# Patient Record
Sex: Female | Born: 1970 | Race: Black or African American | Hispanic: No | Marital: Single | Smoking: Current every day smoker
Health system: Southern US, Community
[De-identification: ages and names within clinical notes are randomized; demographics above are authoritative.]

## PROBLEM LIST (undated history)

## (undated) DIAGNOSIS — E119 Type 2 diabetes mellitus without complications: Secondary | ICD-10-CM

## (undated) DIAGNOSIS — F329 Major depressive disorder, single episode, unspecified: Secondary | ICD-10-CM

## (undated) DIAGNOSIS — F32A Depression, unspecified: Secondary | ICD-10-CM

---

## 2016-04-06 ENCOUNTER — Encounter (HOSPITAL_BASED_OUTPATIENT_CLINIC_OR_DEPARTMENT_OTHER): Payer: Self-pay | Admitting: Emergency Medicine

## 2016-04-06 ENCOUNTER — Emergency Department (HOSPITAL_BASED_OUTPATIENT_CLINIC_OR_DEPARTMENT_OTHER)
Admission: EM | Admit: 2016-04-06 | Discharge: 2016-04-06 | Disposition: A | Discharge status: Home / Self Care | Payer: Self-pay | Attending: Emergency Medicine | Admitting: Emergency Medicine

## 2016-04-06 ENCOUNTER — Emergency Department (HOSPITAL_BASED_OUTPATIENT_CLINIC_OR_DEPARTMENT_OTHER): Payer: Self-pay

## 2016-04-06 DIAGNOSIS — Y999 Unspecified external cause status: Secondary | ICD-10-CM | POA: Insufficient documentation

## 2016-04-06 DIAGNOSIS — F329 Major depressive disorder, single episode, unspecified: Secondary | ICD-10-CM | POA: Insufficient documentation

## 2016-04-06 DIAGNOSIS — Y9389 Activity, other specified: Secondary | ICD-10-CM | POA: Insufficient documentation

## 2016-04-06 DIAGNOSIS — X501XXA Overexertion from prolonged static or awkward postures, initial encounter: Secondary | ICD-10-CM | POA: Insufficient documentation

## 2016-04-06 DIAGNOSIS — F17200 Nicotine dependence, unspecified, uncomplicated: Secondary | ICD-10-CM | POA: Insufficient documentation

## 2016-04-06 DIAGNOSIS — Y92810 Car as the place of occurrence of the external cause: Secondary | ICD-10-CM | POA: Insufficient documentation

## 2016-04-06 DIAGNOSIS — M25562 Pain in left knee: Secondary | ICD-10-CM | POA: Insufficient documentation

## 2016-04-06 HISTORY — DX: Depression, unspecified: F32.A

## 2016-04-06 HISTORY — DX: Major depressive disorder, single episode, unspecified: F32.9

## 2016-04-06 MED ORDER — IBUPROFEN 800 MG PO TABS
800.0000 mg | ORAL_TABLET | Freq: Once | ORAL | Status: AC
  Administered 2016-04-06: 800 mg via ORAL
  Filled 2016-04-06: qty 1

## 2016-04-06 NOTE — Discharge Instructions (Signed)

## 2016-04-06 NOTE — ED Notes (Signed)
Pt in c/o L knee pain after twisting it and feeling a pop while getting a child out of a car. Pt ambulatory to triage with limp in NAD.

## 2016-04-06 NOTE — ED Provider Notes (Signed)
CSN: [id number]     Arrival date & time 04/06/16  1313 History   First MD Initiated Contact with Patient 04/06/16 1328     Chief Complaint  Patient presents with  . Knee Pain   Patient is a 45 y.o. female presenting with knee pain.  Knee Pain Location:  Knee Injury: yes   Mechanism of injury comment:  Twisting Knee location:  L knee Pain details:    Quality:  Aching   Radiates to:  Does not radiate   Severity:  Moderate   Onset quality:  Sudden   Timing:  Constant   Progression:  Unchanged Chronicity:  New Prior injury to area:  No Relieved by:  None tried Worsened by:  Bearing weight Ineffective treatments:  None tried Associated symptoms: swelling   Associated symptoms: no decreased ROM, no muscle weakness and no numbness   Risk factors: obesity    Ms. Meredeth IdeFleming is a 45 year old female presenting with knee pain. Patient was lifting her child out of the car seat when she twisted on a planted left leg and felt her knee pop. She denies falling. 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 The pain is lateral, anterior and medial. The pain does not radiate into the thigh or lower leg. She also is that her left knee and now looks more swollen than her right knee. She has not tried any over-the-counter medications for her pain. She denies numbness or weakness of the left lower extremity. She does not have an orthopedic doctor. Denies other injuries today.  Past Medical History  Diagnosis Date  . Depression    History reviewed. No pertinent past surgical history. History reviewed. No pertinent family history. Social History  Substance Use Topics  . Smoking status: Current Every Day Smoker  . Smokeless tobacco: None  . Alcohol Use: No   OB History    No data available     Review of Systems  All other systems reviewed and are negative.     Allergies  Review of patient's allergies indicates no known allergies.  Home  Medications   Prior to Admission medications   Not on File   BP 147/100 mmHg  Pulse 86  Temp(Src) 98.8 F (37.1 C) (Oral)  Resp 20  Ht 5\' 3"  (1.6 m)  Wt 132.45 kg  BMI 51.74 kg/m2  SpO2 98%  LMP 03/23/2016 Physical Exam  Constitutional: She appears well-developed and well-nourished. No distress.  HENT:  Head: Normocephalic and atraumatic.  Right Ear: External ear normal.  Left Ear: External ear normal.  Eyes: Conjunctivae are normal. Right eye exhibits no discharge. Left eye exhibits no discharge. No scleral icterus.  Neck: Normal range of motion.  Cardiovascular: Normal rate and intact distal pulses.   Feet warm. Pedal pulses palpable  Pulmonary/Chest: Effort normal.  Musculoskeletal: Normal range of motion.       Left knee: She exhibits normal range of motion, no swelling, no deformity, no erythema, normal alignment, no LCL laxity and no MCL laxity. Tenderness found. Medial joint line and lateral joint line tenderness noted.  Tenderness to palpation over the lateral joint line, anterior knee and medial joint line. No obvious swelling or deformity but the exam is limited by patient's morbid obesity. Full range of motion of the left knee intact. No ligamentous laxity. Patient is able to ambulate favoring the left knee  Neurological: She is alert. Coordination normal.  5/5 strength of BLE. Sensation intact to light  touch throughout.   Skin: Skin is warm and dry.  Psychiatric: She has a normal mood and affect. Her behavior is normal.  Nursing note and vitals reviewed.   ED Course  Procedures (including critical care time) Labs Review Labs Reviewed - No data to display  Imaging Review Dg Knee Complete 4 Views Left  04/06/2016  CLINICAL DATA:  Left knee injury gating baby out of car today. Pain along the medial aspect of the knee. EXAM: LEFT KNEE - COMPLETE 4+ VIEW COMPARISON:  None available. FINDINGS: The left knee is located. Mild degenerative changes are noted. Joint spaces  preserved. No acute osseous abnormality is present. A small joint effusion is suspected IMPRESSION: 1. No acute osseous abnormality. 2. Probable small joint effusion. Internal derangement is not excluded. This is unlikely. The effusion is likely inflammatory. Electronically Signed   By: Marin Robertshristopher  Mattern M.D.   On: 04/06/2016 13:41   I have personally reviewed and evaluated these images and lab results as part of my medical decision-making.   EKG Interpretation None      MDM   Final diagnoses:  Left knee pain   Patient presenting with left knee pain after twisting injury. Left lower extremity is neurovascularly intact with FROM. TTP at anterior, medial and lateral joint lines. No obvious swelling. Patient X-Ray negative for obvious fracture or dislocation. Question small effusion. Pain managed in ED with ibuprofen. Pt is able to ambulate with a steady gait. Brace and crutches given and conservative therapy recommended. Discussed RICE therapy and use of OTC pain relievers. Pt advised to follow up with orthopedics if symptoms persist. Return precautions discussed at bedside and given in discharge paperwork. Pt is stable for discharge.     Alveta HeimlichStevi Brittanie Dosanjh, PA-C 04/06/16 1445  Laurence Spatesachel Morgan Little, MD 04/06/16 [phone]

## 2016-05-18 ENCOUNTER — Emergency Department (HOSPITAL_BASED_OUTPATIENT_CLINIC_OR_DEPARTMENT_OTHER)
Admission: EM | Admit: 2016-05-18 | Discharge: 2016-05-18 | Disposition: A | Discharge status: Home / Self Care | Payer: Medicaid Other | Attending: Emergency Medicine | Admitting: Emergency Medicine

## 2016-05-18 ENCOUNTER — Encounter (HOSPITAL_BASED_OUTPATIENT_CLINIC_OR_DEPARTMENT_OTHER): Payer: Self-pay | Admitting: Emergency Medicine

## 2016-05-18 DIAGNOSIS — Z79899 Other long term (current) drug therapy: Secondary | ICD-10-CM | POA: Insufficient documentation

## 2016-05-18 DIAGNOSIS — M62838 Other muscle spasm: Secondary | ICD-10-CM | POA: Insufficient documentation

## 2016-05-18 DIAGNOSIS — F17200 Nicotine dependence, unspecified, uncomplicated: Secondary | ICD-10-CM | POA: Insufficient documentation

## 2016-05-18 MED ORDER — NAPROXEN 500 MG PO TABS: 500.0000 mg | ORAL_TABLET | Freq: Two times a day (BID) | ORAL | 0 refills | Status: DC

## 2016-05-18 MED ORDER — METHOCARBAMOL 500 MG PO TABS
1000.0000 mg | ORAL_TABLET | Freq: Once | ORAL | Status: AC
  Administered 2016-05-18: 1000 mg via ORAL
  Filled 2016-05-18: qty 2

## 2016-05-18 MED ORDER — METHOCARBAMOL 500 MG PO TABS: 500.0000 mg | ORAL_TABLET | Freq: Two times a day (BID) | ORAL | 0 refills | Status: DC

## 2016-05-18 MED ORDER — IBUPROFEN 800 MG PO TABS
800.0000 mg | ORAL_TABLET | Freq: Once | ORAL | Status: AC
  Administered 2016-05-18: 800 mg via ORAL
  Filled 2016-05-18: qty 1

## 2016-05-18 NOTE — ED Triage Notes (Signed)
Pt reports she awoke from sleep 3 or 4 days ago with right shoulder pain denies event or injury OTC meds ineffective

## 2016-05-18 NOTE — ED Notes (Signed)
Woke w rt shoulder pain on Friday  Denies inj

## 2016-05-18 NOTE — ED Provider Notes (Addendum)
[facility]   CSN: [id number] Arrival date & time: 05/18/16  [id number]  First Provider Contact:  First MD Initiated Contact with Patient 05/18/16 [phone]        History   Chief Complaint Chief Complaint  Patient presents with  . Shoulder Pain    HPI Michelle Arroyo is a 45 y.o. female.  The history is provided by the patient. No language interpreter was used.  Shoulder Pain   This is a new problem. The current episode started more than 2 days ago. The problem occurs constantly. The problem has not changed since onset.The pain is present in the right shoulder. The quality of the pain is described as aching. The pain is moderate. Pertinent negatives include no numbness, no tingling and no itching. Exacerbated by: nothing. Treatments tried: one dose of ibuprofen a few days  The treatment provided no relief. There has been no history of extremity trauma. Family history is significant for no rheumatoid arthritis and no gout.    Past Medical History:  Diagnosis Date  . Depression     There are no active problems to display for this patient.   History reviewed. No pertinent surgical history.  OB History    No data available       Home Medications    Prior to Admission medications   Medication Sig Start Date End Date Taking? Authorizing Provider  sertraline (ZOLOFT) 25 MG tablet Take 10 mg by mouth 2 times daily at 12 noon and 4 pm.   Yes Historical Provider, MD    Family History No family history on file.  Social History Social History  Substance Use Topics  . Smoking status: Current Every Day Smoker  . Smokeless tobacco: Never Used  . Alcohol use No     Allergies   Review of patient's allergies indicates no known allergies.   Review of Systems Review of Systems  Constitutional: Negative for fever.  Skin: Negative for itching.  Neurological: Negative for tingling, weakness and numbness.  All other systems reviewed and are  negative.    Physical Exam Updated Vital Signs BP 133/85 (BP Location: Left Arm)   Pulse 88   Temp 98.6 F (37 C) (Oral)   Resp 20   Ht 5\' 3"  (1.6 m)   Wt 285 lb (129.3 kg)   LMP 05/11/2016   SpO2 97%   BMI 50.49 kg/m   Physical Exam  Constitutional: She is oriented to person, place, and time. She appears well-developed and well-nourished.  HENT:  Head: Normocephalic and atraumatic.  Mouth/Throat: No oropharyngeal exudate.  Eyes: EOM are normal. Pupils are equal, round, and reactive to light.  Neck: Normal range of motion. Neck supple.  Cardiovascular: Normal rate, regular rhythm and intact distal pulses.   Pulmonary/Chest: Effort normal and breath sounds normal. She has no wheezes.  Abdominal: Soft. Bowel sounds are normal. She exhibits no mass. There is no tenderness. There is no rebound and no guarding.  Musculoskeletal:       Right shoulder: She exhibits spasm. She exhibits normal range of motion, no tenderness, no bony tenderness, no swelling, no effusion, no crepitus, no deformity, no laceration, no pain, normal pulse and normal strength.       Cervical back: Normal.  Negative Neer's.  FROM with pain  Neurological: She is alert and oriented to person, place, and time. She has normal reflexes.  Skin: Skin is warm and dry. Capillary refill takes less than 2 seconds.  Psychiatric: She has  a normal mood and affect.     ED Treatments / Results  Labs (all labs ordered are listed, but only abnormal results are displayed) Labs Reviewed - No data to display  EKG  EKG Interpretation None       Radiology No results found.  Procedures Procedures (including critical care time)  Medications Ordered in ED Medications  methocarbamol (ROBAXIN) tablet 1,000 mg (not administered)  ibuprofen (ADVIL,MOTRIN) tablet 800 mg (800 mg Oral Given 05/18/16 0355)     Initial Impression / Assessment and Plan / ED Course  I have reviewed the triage vital signs and the nursing  notes.  Pertinent labs & imaging results that were available during my care of the patient were reviewed by me and considered in my medical decision making (see chart for details).  Clinical Course   Vitals:   05/18/16 0107 05/18/16 0534  BP: 153/96 133/85  Pulse: 88 88  Resp: 20 20  Temp: 98.6 F (37 C)      Final Clinical Impressions(s) / ED Diagnoses   Final diagnoses:  None   Medications  methocarbamol (ROBAXIN) tablet 1,000 mg (not administered)  ibuprofen (ADVIL,MOTRIN) tablet 800 mg (800 mg Oral Given 05/18/16 0355)    New Prescriptions New Prescriptions   No medications on file  All questions answered to patient's satisfaction. Based on history and exam patient has been appropriately medically screened and emergency conditions excluded. Patient is stable for discharge at this time. Follow up with your PMDfor recheck in 2 daysand strict return precautions given.   Cy Blamer, MD 05/18/16 1610    Early Steel, MD 05/18/16 [phone]

## 2018-06-16 IMAGING — DX DG KNEE COMPLETE 4+V*L*
4 series · 4 of 4 positions shown · non-contrast
Comparison: None available.

CLINICAL DATA: Left knee injury gating baby out of car today. Pain
along the medial aspect of the knee.

EXAM:
LEFT KNEE - COMPLETE 4+ VIEW

[knee ap]
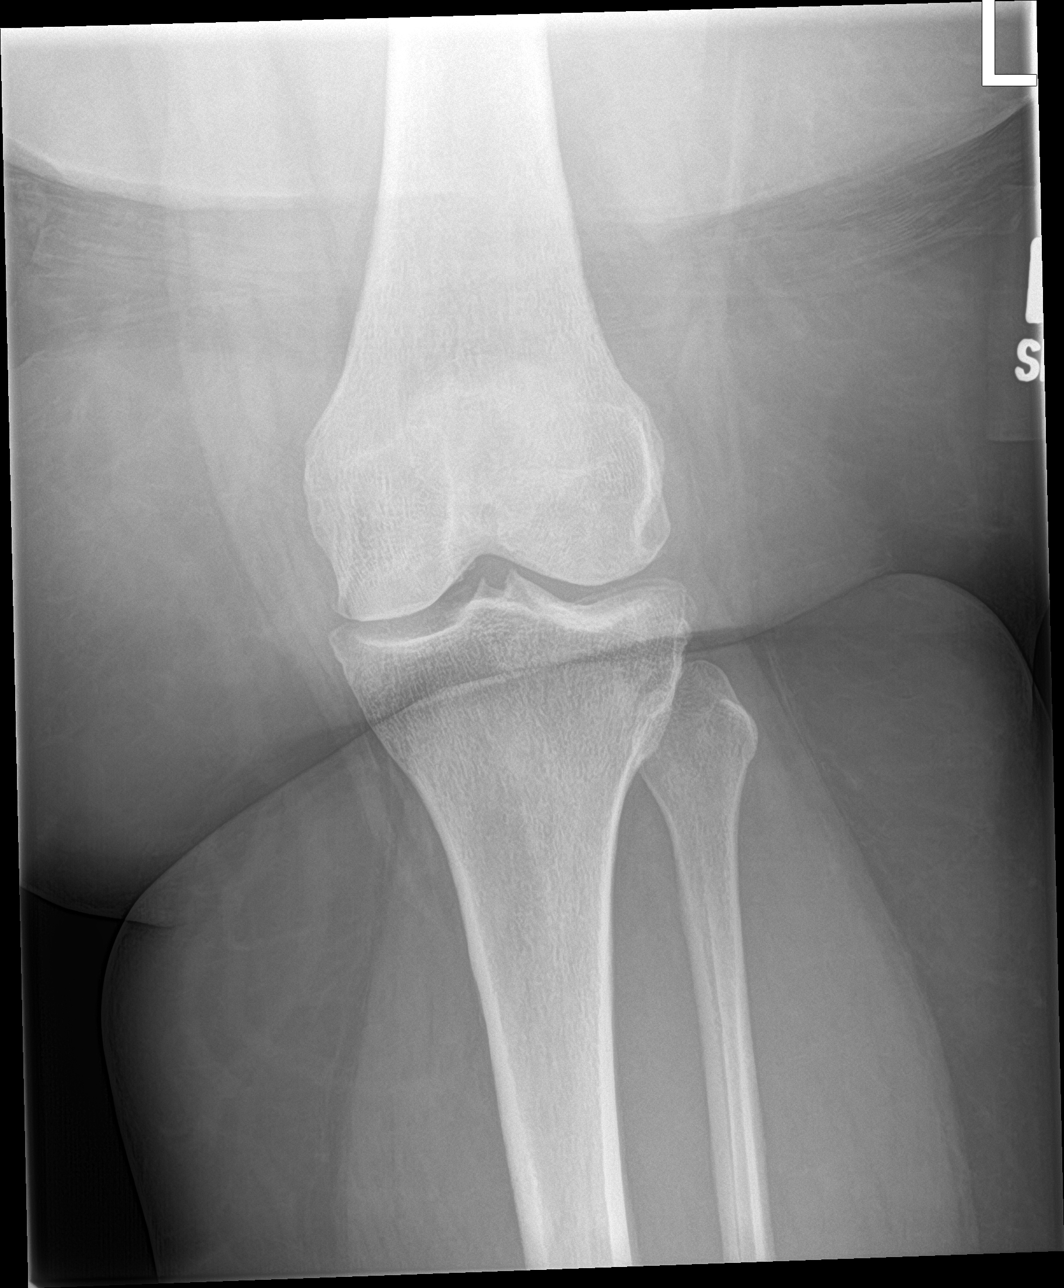

[knee lat]
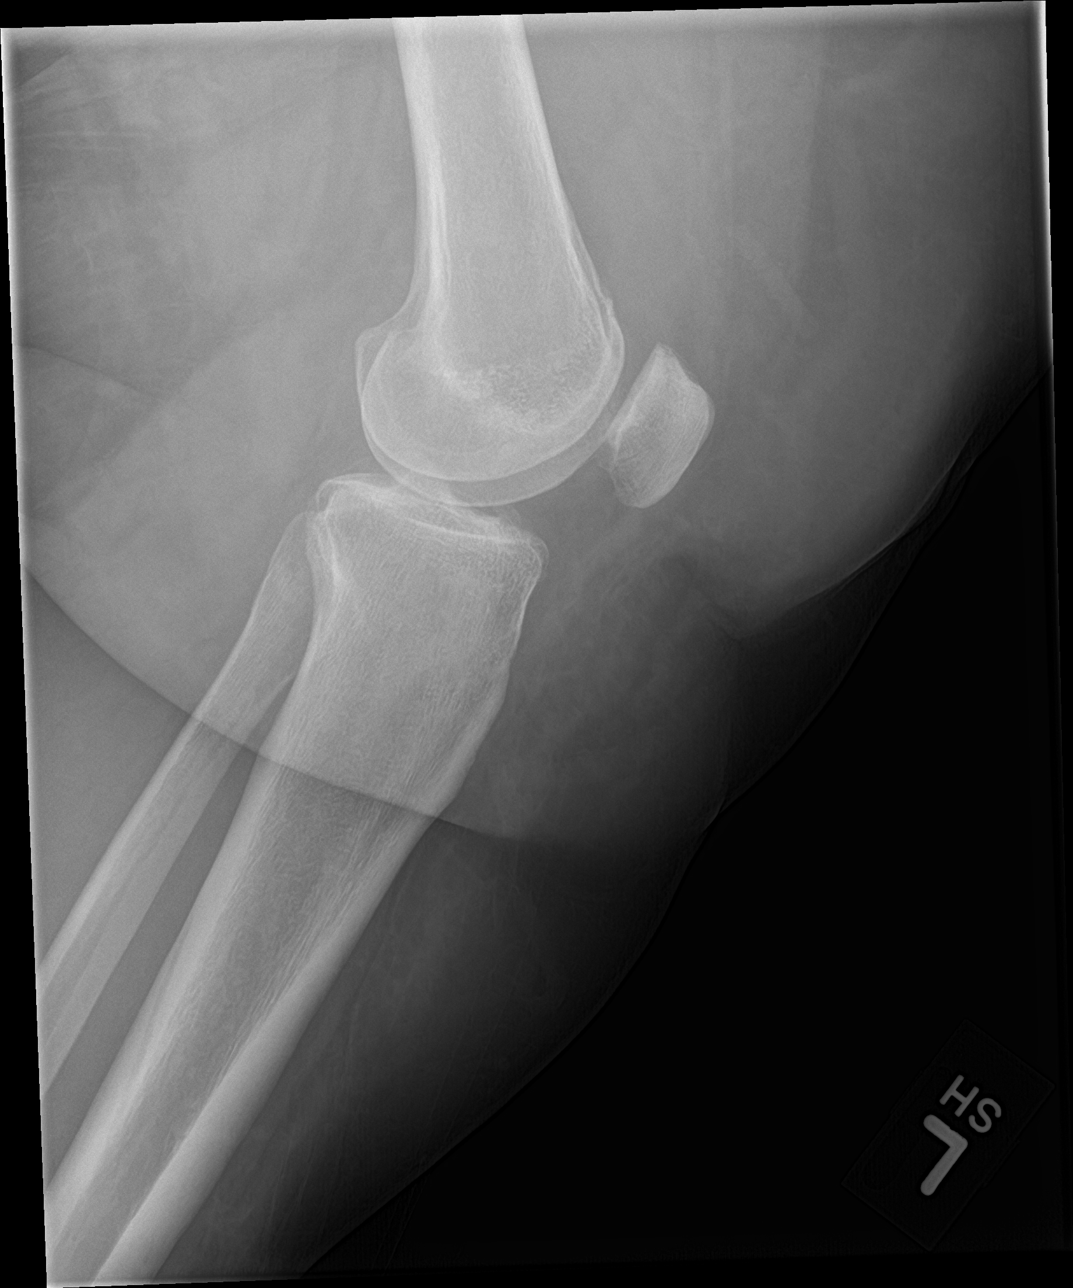

[knee obl (1 of 2)]
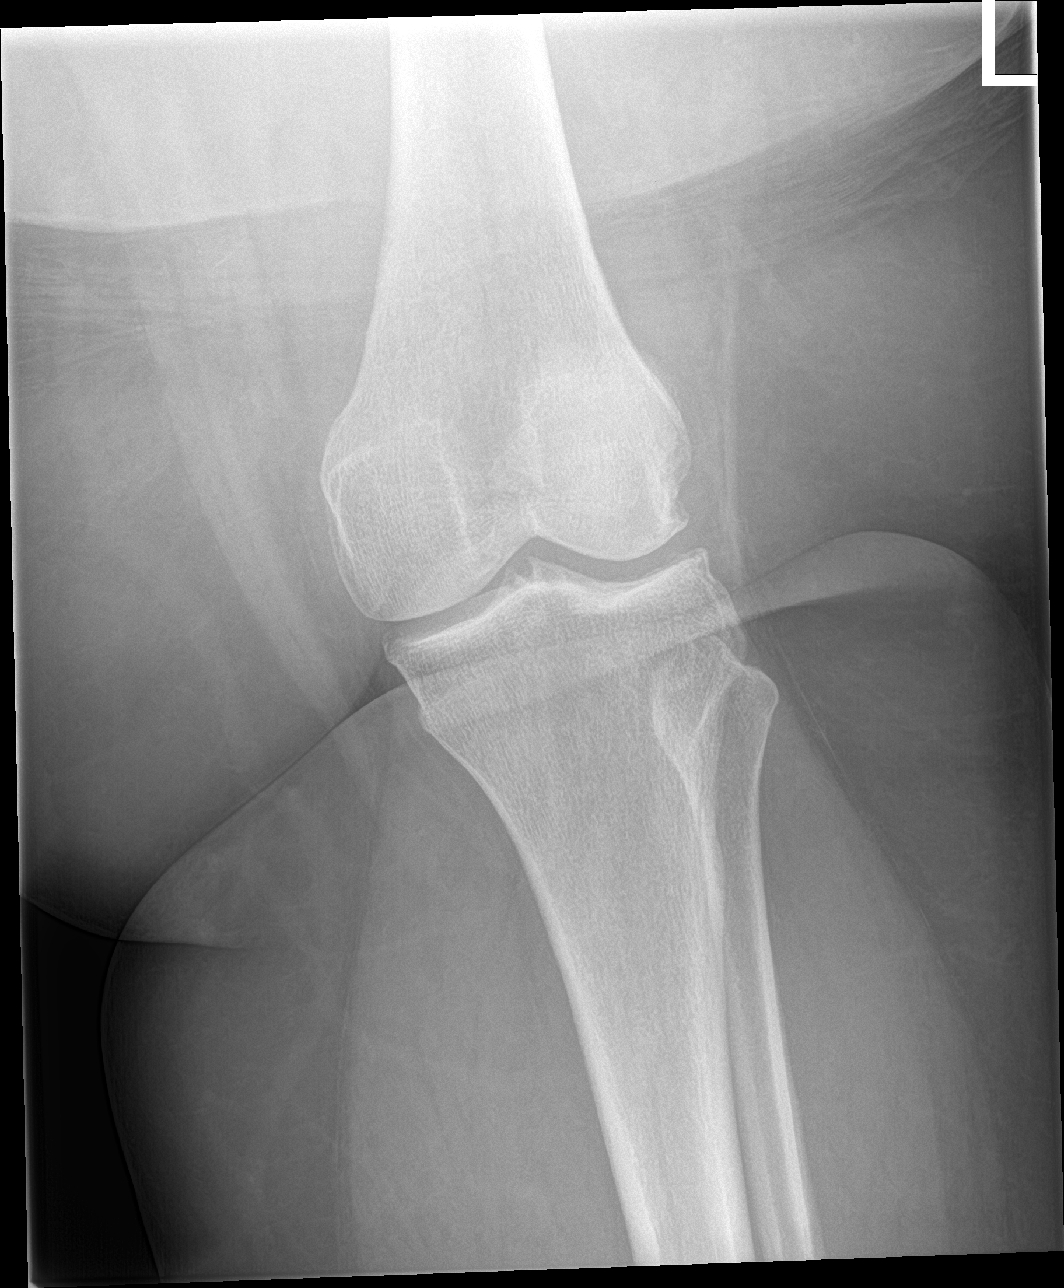

[knee obl (2 of 2)]
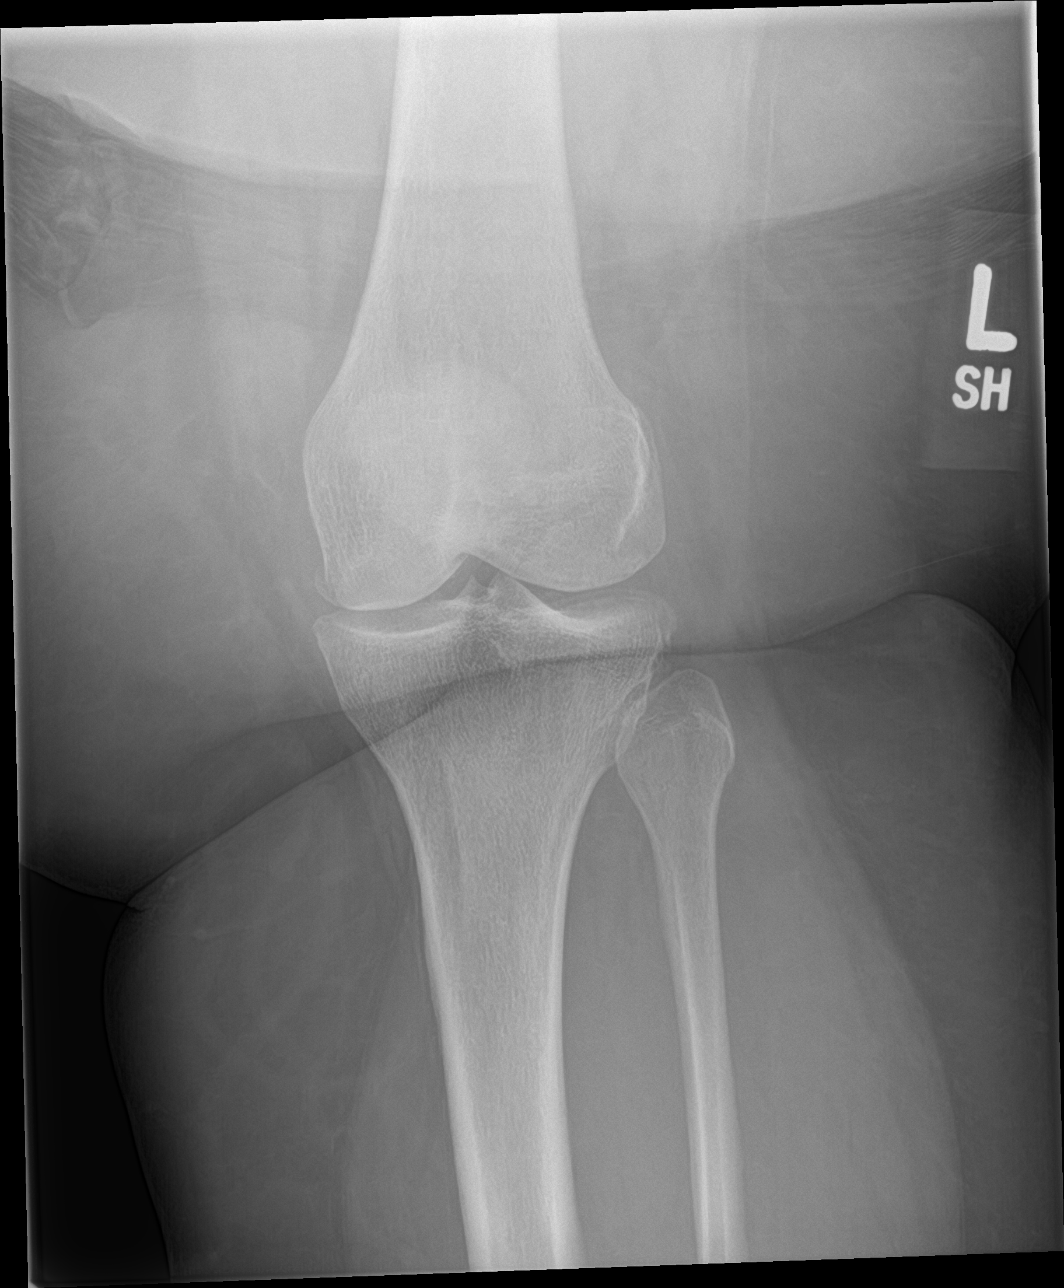

[4 of 4 positions shown; findings below may reference images not displayed]

FINDINGS: The left knee is located. Mild degenerative changes are noted. Joint
spaces preserved. No acute osseous abnormality is present. A small
joint effusion is suspected
IMPRESSION: 1. No acute osseous abnormality.
2. Probable small joint effusion. Internal derangement is not
excluded. This is unlikely. The effusion is likely inflammatory.

## 2021-05-06 ENCOUNTER — Other Ambulatory Visit: Payer: Self-pay

## 2021-05-06 ENCOUNTER — Emergency Department (HOSPITAL_BASED_OUTPATIENT_CLINIC_OR_DEPARTMENT_OTHER)
Admission: EM | Admit: 2021-05-06 | Discharge: 2021-05-06 | Disposition: A | Discharge status: Home / Self Care | Payer: Medicare Other | Attending: Emergency Medicine | Admitting: Emergency Medicine

## 2021-05-06 ENCOUNTER — Encounter (HOSPITAL_BASED_OUTPATIENT_CLINIC_OR_DEPARTMENT_OTHER): Payer: Self-pay

## 2021-05-06 ENCOUNTER — Emergency Department (HOSPITAL_BASED_OUTPATIENT_CLINIC_OR_DEPARTMENT_OTHER): Payer: Medicare Other

## 2021-05-06 DIAGNOSIS — K5732 Diverticulitis of large intestine without perforation or abscess without bleeding: Secondary | ICD-10-CM | POA: Insufficient documentation

## 2021-05-06 DIAGNOSIS — E119 Type 2 diabetes mellitus without complications: Secondary | ICD-10-CM | POA: Insufficient documentation

## 2021-05-06 DIAGNOSIS — F17200 Nicotine dependence, unspecified, uncomplicated: Secondary | ICD-10-CM | POA: Insufficient documentation

## 2021-05-06 DIAGNOSIS — R109 Unspecified abdominal pain: Secondary | ICD-10-CM | POA: Diagnosis present

## 2021-05-06 DIAGNOSIS — K5792 Diverticulitis of intestine, part unspecified, without perforation or abscess without bleeding: Secondary | ICD-10-CM

## 2021-05-06 HISTORY — DX: Type 2 diabetes mellitus without complications: E11.9

## 2021-05-06 LAB — CBC WITH DIFFERENTIAL/PLATELET
Abs Immature Granulocytes: 0.05 10*3/uL (ref 0.00–0.07)
Basophils Absolute: 0 10*3/uL (ref 0.0–0.1)
Basophils Relative: 0 %
Eosinophils Absolute: 0.2 10*3/uL (ref 0.0–0.5)
Eosinophils Relative: 2 %
HCT: 40.1 % (ref 36.0–46.0)
Hemoglobin: 13 g/dL (ref 12.0–15.0)
Immature Granulocytes: 0 %
Lymphocytes Relative: 15 %
Lymphs Abs: 1.8 10*3/uL (ref 0.7–4.0)
MCH: 27.3 pg (ref 26.0–34.0)
MCHC: 32.4 g/dL (ref 30.0–36.0)
MCV: 84.2 fL (ref 80.0–100.0)
Monocytes Absolute: 1.1 10*3/uL — ABNORMAL HIGH (ref 0.1–1.0)
Monocytes Relative: 9 %
Neutro Abs: 9.2 10*3/uL — ABNORMAL HIGH (ref 1.7–7.7)
Neutrophils Relative %: 74 %
Platelets: 318 10*3/uL (ref 150–400)
RBC: 4.76 MIL/uL (ref 3.87–5.11)
RDW: 14.5 % (ref 11.5–15.5)
WBC: 12.4 10*3/uL — ABNORMAL HIGH (ref 4.0–10.5)
nRBC: 0 % (ref 0.0–0.2)

## 2021-05-06 LAB — URINALYSIS, MICROSCOPIC (REFLEX)

## 2021-05-06 LAB — URINALYSIS, ROUTINE W REFLEX MICROSCOPIC
Glucose, UA: NEGATIVE mg/dL
Ketones, ur: NEGATIVE mg/dL
Leukocytes,Ua: NEGATIVE
Nitrite: NEGATIVE
Protein, ur: NEGATIVE mg/dL
Specific Gravity, Urine: 1.025 (ref 1.005–1.030)
pH: 6 (ref 5.0–8.0)

## 2021-05-06 LAB — BASIC METABOLIC PANEL
Anion gap: 7 (ref 5–15)
BUN: 10 mg/dL (ref 6–20)
CO2: 26 mmol/L (ref 22–32)
Calcium: 8.2 mg/dL — ABNORMAL LOW (ref 8.9–10.3)
Chloride: 102 mmol/L (ref 98–111)
Creatinine, Ser: 0.68 mg/dL (ref 0.44–1.00)
GFR, Estimated: >60 mL/min (ref >60)
Glucose, Bld: 177 mg/dL — ABNORMAL HIGH (ref 70–99)
Potassium: 3.4 mmol/L — ABNORMAL LOW (ref 3.5–5.1)
Sodium: 135 mmol/L (ref 135–145)

## 2021-05-06 LAB — PREGNANCY, URINE: Preg Test, Ur: NEGATIVE

## 2021-05-06 LAB — CBG MONITORING, ED: Glucose-Capillary: 176 mg/dL — ABNORMAL HIGH (ref 70–99)

## 2021-05-06 MED ORDER — CIPROFLOXACIN HCL 500 MG PO TABS
500.0000 mg | ORAL_TABLET | Freq: Once | ORAL | Status: AC
  Administered 2021-05-06: 500 mg via ORAL
  Filled 2021-05-06: qty 1

## 2021-05-06 MED ORDER — METRONIDAZOLE 500 MG PO TABS
500.0000 mg | ORAL_TABLET | Freq: Once | ORAL | Status: AC
  Administered 2021-05-06: 500 mg via ORAL
  Filled 2021-05-06: qty 1

## 2021-05-06 MED ORDER — MORPHINE SULFATE (PF) 4 MG/ML IV SOLN
4.0000 mg | Freq: Once | INTRAVENOUS | Status: AC
  Administered 2021-05-06: 4 mg via INTRAVENOUS
  Filled 2021-05-06: qty 1

## 2021-05-06 MED ORDER — HYDROCODONE-ACETAMINOPHEN 5-325 MG PO TABS
2.0000 | ORAL_TABLET | Freq: Once | ORAL | Status: AC
  Administered 2021-05-06: 2 via ORAL
  Filled 2021-05-06: qty 2

## 2021-05-06 MED ORDER — CIPROFLOXACIN HCL 500 MG PO TABS: 500.0000 mg | ORAL_TABLET | Freq: Two times a day (BID) | ORAL | 0 refills | Status: DC

## 2021-05-06 MED ORDER — HYDROCODONE-ACETAMINOPHEN 5-325 MG PO TABS: 1.0000 | ORAL_TABLET | Freq: Four times a day (QID) | ORAL | 0 refills | Status: DC | PRN

## 2021-05-06 MED ORDER — ONDANSETRON HCL 4 MG/2ML IJ SOLN
4.0000 mg | Freq: Once | INTRAMUSCULAR | Status: AC
  Administered 2021-05-06: 4 mg via INTRAVENOUS
  Filled 2021-05-06: qty 2

## 2021-05-06 MED ORDER — KETOROLAC TROMETHAMINE 30 MG/ML IJ SOLN
30.0000 mg | Freq: Once | INTRAMUSCULAR | Status: AC
  Administered 2021-05-06: 30 mg via INTRAVENOUS
  Filled 2021-05-06: qty 1

## 2021-05-06 MED ORDER — METRONIDAZOLE 500 MG PO TABS: 500.0000 mg | ORAL_TABLET | Freq: Three times a day (TID) | ORAL | 0 refills | Status: DC

## 2021-05-06 NOTE — ED Provider Notes (Signed)
[facility] EMERGENCY DEPARTMENT Provider Note   CSN: [id number] Arrival date & time: 05/06/21  0025     History Chief Complaint  Patient presents with   Flank Pain    Michelle Arroyo is a 50 y.o. female.  Patient is a 50 year old female with past medical history of diabetes, depression.  Patient presenting with complaints of left flank pain.  This started 2 days ago in the absence of any injury or trauma.  She describes constant pain in the left flank radiating to the left groin.  This is worse when she moves or changes position.  She denies any bowel or bladder complaints.  She denies any fevers or chills.  The history is provided by the patient.  Flank Pain This is a new problem. The current episode started 2 days ago. The problem occurs constantly. The problem has been gradually worsening. Exacerbated by: Movement and palpation. Nothing relieves the symptoms.      Past Medical History:  Diagnosis Date   Depression    Diabetes mellitus without complication (HCC)     There are no problems to display for this patient.   Past Surgical History:  Procedure Laterality Date   CHOLECYSTECTOMY       OB History   No obstetric history on file.     No family history on file.  Social History   Tobacco Use   Smoking status: Every Day   Smokeless tobacco: Never  Vaping Use   Vaping Use: Never used  Substance Use Topics   Alcohol use: No   Drug use: Never    Home Medications Prior to Admission medications   Medication Sig Start Date End Date Taking? Authorizing Provider  methocarbamol (ROBAXIN) 500 MG tablet Take 1 tablet (500 mg total) by mouth 2 (two) times daily. 05/18/16   Palumbo, April, MD  naproxen (NAPROSYN) 500 MG tablet Take 1 tablet (500 mg total) by mouth 2 (two) times daily. 05/18/16   Palumbo, April, MD  sertraline (ZOLOFT) 25 MG tablet Take 10 mg by mouth 2 times daily at 12 noon and 4 pm.    [provider]    Allergies    Patient has  no known allergies.  Review of Systems   Review of Systems  Genitourinary:  Positive for flank pain.  All other systems reviewed and are negative.  Physical Exam Updated Vital Signs BP (!) 146/97 (BP Location: Left Arm)   Pulse (!) 109   Temp 99.1 F (37.3 C) (Oral)   Resp 18   Ht 5\' 3"  (1.6 m)   Wt 133.8 kg   SpO2 97%   BMI 52.26 kg/m   Physical Exam Vitals and nursing note reviewed.  Constitutional:      General: She is not in acute distress.    Appearance: She is well-developed. She is not diaphoretic.  HENT:     Head: Normocephalic and atraumatic.  Cardiovascular:     Rate and Rhythm: Normal rate and regular rhythm.     Heart sounds: No murmur heard.   No friction rub. No gallop.  Pulmonary:     Effort: Pulmonary effort is normal. No respiratory distress.     Breath sounds: Normal breath sounds. No wheezing.  Abdominal:     General: Bowel sounds are normal. There is no distension.     Palpations: Abdomen is soft.     Tenderness: There is abdominal tenderness. There is left CVA tenderness. There is no guarding or rebound.  Comments: There is tenderness to palpation in the left lower quadrant and left flank.  Musculoskeletal:        General: Normal range of motion.     Cervical back: Normal range of motion and neck supple.  Skin:    General: Skin is warm and dry.  Neurological:     General: No focal deficit present.     Mental Status: She is alert and oriented to person, place, and time.    ED Results / Procedures / Treatments   Labs (all labs ordered are listed, but only abnormal results are displayed) Labs Reviewed  CBG MONITORING, ED - Abnormal; Notable for the following components:      Result Value   Glucose-Capillary 176 (*)    All other components within normal limits  BASIC METABOLIC PANEL  CBC WITH DIFFERENTIAL/PLATELET  URINALYSIS, ROUTINE W REFLEX MICROSCOPIC    EKG None  Radiology No results found.  Procedures Procedures    Medications Ordered in ED Medications  morphine 4 MG/ML injection 4 mg (has no administration in time range)  ketorolac (TORADOL) 30 MG/ML injection 30 mg (has no administration in time range)  ondansetron (ZOFRAN) injection 4 mg (has no administration in time range)    ED Course  I have reviewed the triage vital signs and the nursing notes.  Pertinent labs & imaging results that were available during my care of the patient were reviewed by me and considered in my medical decision making (see chart for details).    MDM Rules/Calculators/A&P  CT scan shows acute, uncomplicated diverticulitis.  Patient to be treated with Cipro and Flagyl, pain medication, and follow-up with primary doctor.  Final Clinical Impression(s) / ED Diagnoses Final diagnoses:  None    Rx / DC Orders ED Discharge Orders     None        Geoffery Lyons, MD 05/06/21 [phone]

## 2021-05-06 NOTE — ED Triage Notes (Signed)
Patient reports had covid shot on Saturday now has a knot on her arm but having a sharp pain to left flank and unsure if it is related.

## 2021-05-06 NOTE — Discharge Instructions (Addendum)
Begin taking Cipro and Flagyl as prescribed.  Take hydrocodone as prescribed as needed for pain.  Follow-up with primary doctor in 1 week, and return to the ER if you develop worsening abdominal pain, high fevers, bloody stools, or other new and concerning symptoms.

## 2022-04-09 ENCOUNTER — Encounter (HOSPITAL_BASED_OUTPATIENT_CLINIC_OR_DEPARTMENT_OTHER): Payer: Self-pay

## 2022-04-09 ENCOUNTER — Other Ambulatory Visit: Payer: Self-pay

## 2022-04-09 DIAGNOSIS — R509 Fever, unspecified: Secondary | ICD-10-CM | POA: Insufficient documentation

## 2022-04-09 DIAGNOSIS — E119 Type 2 diabetes mellitus without complications: Secondary | ICD-10-CM | POA: Diagnosis not present

## 2022-04-09 DIAGNOSIS — R197 Diarrhea, unspecified: Secondary | ICD-10-CM | POA: Diagnosis not present

## 2022-04-09 DIAGNOSIS — F17210 Nicotine dependence, cigarettes, uncomplicated: Secondary | ICD-10-CM | POA: Diagnosis not present

## 2022-04-09 DIAGNOSIS — R1032 Left lower quadrant pain: Secondary | ICD-10-CM | POA: Insufficient documentation

## 2022-04-09 DIAGNOSIS — R112 Nausea with vomiting, unspecified: Secondary | ICD-10-CM | POA: Diagnosis not present

## 2022-04-09 LAB — URINALYSIS, ROUTINE W REFLEX MICROSCOPIC
Bilirubin Urine: NEGATIVE
Glucose, UA: NEGATIVE mg/dL
Ketones, ur: NEGATIVE mg/dL
Leukocytes,Ua: NEGATIVE
Nitrite: NEGATIVE
Protein, ur: 30 mg/dL — AB
Specific Gravity, Urine: >=1.03 (ref 1.005–1.030)
pH: 5.5 (ref 5.0–8.0)

## 2022-04-09 LAB — PREGNANCY, URINE: Preg Test, Ur: NEGATIVE

## 2022-04-09 LAB — URINALYSIS, MICROSCOPIC (REFLEX)

## 2022-04-09 NOTE — ED Triage Notes (Signed)
LLQ pain x2 days that wraps around pts back. Pt reports h/x of diverticulitis. Pt endorses N/V/D.

## 2022-04-09 NOTE — ED Notes (Addendum)
Attempted to collect bloodwork x2.

## 2022-04-10 ENCOUNTER — Encounter (HOSPITAL_BASED_OUTPATIENT_CLINIC_OR_DEPARTMENT_OTHER): Payer: Self-pay

## 2022-04-10 ENCOUNTER — Emergency Department (HOSPITAL_BASED_OUTPATIENT_CLINIC_OR_DEPARTMENT_OTHER)
Admission: EM | Admit: 2022-04-10 | Discharge: 2022-04-10 | Disposition: A | Discharge status: Home / Self Care | Payer: Medicare Other | Attending: Emergency Medicine | Admitting: Emergency Medicine

## 2022-04-10 ENCOUNTER — Emergency Department (HOSPITAL_BASED_OUTPATIENT_CLINIC_OR_DEPARTMENT_OTHER): Payer: Medicare Other

## 2022-04-10 DIAGNOSIS — R1032 Left lower quadrant pain: Secondary | ICD-10-CM

## 2022-04-10 LAB — LIPASE, BLOOD: Lipase: 31 U/L (ref 11–51)

## 2022-04-10 LAB — COMPREHENSIVE METABOLIC PANEL
ALT: 17 U/L (ref 0–44)
AST: 21 U/L (ref 15–41)
Albumin: 3.6 g/dL (ref 3.5–5.0)
Alkaline Phosphatase: 75 U/L (ref 38–126)
Anion gap: 6 (ref 5–15)
BUN: 10 mg/dL (ref 6–20)
CO2: 25 mmol/L (ref 22–32)
Calcium: 8.8 mg/dL — ABNORMAL LOW (ref 8.9–10.3)
Chloride: 105 mmol/L (ref 98–111)
Creatinine, Ser: 0.65 mg/dL (ref 0.44–1.00)
GFR, Estimated: >60 mL/min (ref >60)
Glucose, Bld: 218 mg/dL — ABNORMAL HIGH (ref 70–99)
Potassium: 4.2 mmol/L (ref 3.5–5.1)
Sodium: 136 mmol/L (ref 135–145)
Total Bilirubin: 1.1 mg/dL (ref 0.3–1.2)
Total Protein: 8.1 g/dL (ref 6.5–8.1)

## 2022-04-10 LAB — CBC
HCT: 45.4 % (ref 36.0–46.0)
Hemoglobin: 14.9 g/dL (ref 12.0–15.0)
MCH: 27.6 pg (ref 26.0–34.0)
MCHC: 32.8 g/dL (ref 30.0–36.0)
MCV: 84.2 fL (ref 80.0–100.0)
Platelets: 357 10*3/uL (ref 150–400)
RBC: 5.39 MIL/uL — ABNORMAL HIGH (ref 3.87–5.11)
RDW: 14.4 % (ref 11.5–15.5)
WBC: 9 10*3/uL (ref 4.0–10.5)
nRBC: 0 % (ref 0.0–0.2)

## 2022-04-10 MED ORDER — ONDANSETRON HCL 4 MG/2ML IJ SOLN
4.0000 mg | Freq: Once | INTRAMUSCULAR | Status: AC
  Administered 2022-04-10: 4 mg via INTRAVENOUS
  Filled 2022-04-10: qty 2

## 2022-04-10 MED ORDER — SODIUM CHLORIDE 0.9 % IV BOLUS
1000.0000 mL | Freq: Once | INTRAVENOUS | Status: AC
  Administered 2022-04-10: 1000 mL via INTRAVENOUS

## 2022-04-10 MED ORDER — HYDROCODONE-ACETAMINOPHEN 5-325 MG PO TABS: 1.0000 | ORAL_TABLET | Freq: Four times a day (QID) | ORAL | 0 refills | Status: AC | PRN

## 2022-04-10 MED ORDER — FENTANYL CITRATE PF 50 MCG/ML IJ SOSY
100.0000 ug | PREFILLED_SYRINGE | Freq: Once | INTRAMUSCULAR | Status: AC
  Administered 2022-04-10: 100 ug via INTRAVENOUS
  Filled 2022-04-10: qty 2

## 2022-04-10 MED ORDER — HYDROCODONE-ACETAMINOPHEN 5-325 MG PO TABS
1.0000 | ORAL_TABLET | Freq: Once | ORAL | Status: AC
  Administered 2022-04-10: 1 via ORAL
  Filled 2022-04-10: qty 1

## 2022-04-10 MED ORDER — IOHEXOL 300 MG/ML  SOLN
100.0000 mL | Freq: Once | INTRAMUSCULAR | Status: AC | PRN
  Administered 2022-04-10: 100 mL via INTRAVENOUS

## 2022-04-10 NOTE — ED Provider Notes (Signed)
[facility] Provider Note: Lowella Dell, MD, FACEP  CSN: [id number] MRN: [id number] ARRIVAL: 04/09/22 at 2243 ROOM: [facility]04/[facility]04   CHIEF COMPLAINT  Abdominal Pain   HISTORY OF PRESENT ILLNESS  04/10/22 12:53 AM Michelle Arroyo is a 51 y.o. female with a history of diverticulitis.  She is here with 2 days of left lower quadrant pain that radiates around to her left flank.  She rates her pain as a 9 out of 10, worse with movement or palpation.  She has had associated nausea, vomiting, diarrhea and fever.  She has had a fever as high as 101, relieved with Tylenol.   Past Medical History:  Diagnosis Date   Depression    Diabetes mellitus without complication (HCC)     Past Surgical History:  Procedure Laterality Date   CHOLECYSTECTOMY      History reviewed. No pertinent family history.  Social History   Tobacco Use   Smoking status: Every Day    Packs/day: 0.25    Types: Cigarettes   Smokeless tobacco: Never  Vaping Use   Vaping Use: Never used  Substance Use Topics   Alcohol use: Not Currently   Drug use: Never    Prior to Admission medications   Medication Sig Start Date End Date Taking? Authorizing Provider  HYDROcodone-acetaminophen (NORCO) 5-325 MG tablet Take 1 tablet by mouth every 6 (six) hours as needed for severe pain. 04/10/22   Shakari Qazi, MD  sertraline (ZOLOFT) 25 MG tablet Take 10 mg by mouth 2 times daily at 12 noon and 4 pm.    [provider]    Allergies Patient has no known allergies.   REVIEW OF SYSTEMS  Negative except as noted here or in the History of Present Illness.   PHYSICAL EXAMINATION  Initial Vital Signs Blood pressure (!) 151/109, pulse 88, temperature 99.2 F (37.3 C), temperature source Oral, resp. rate 18, height 5\' 3"  (1.6 m), weight 133.8 kg, last menstrual period 05/11/2016, SpO2 98 %.  Examination General: Well-developed, well-nourished female in no acute distress; appearance consistent with age of  record HENT: normocephalic; atraumatic Eyes: Normal appearance Neck: supple Heart: regular rate and rhythm Lungs: clear to auscultation bilaterally Abdomen: soft; nondistended; left lower quadrant and left flank tenderness; bowel sounds present Extremities: No deformity; full range of motion; pulses normal Neurologic: Awake, alert and oriented; motor function intact in all extremities and symmetric; no facial droop Skin: Warm and dry Psychiatric: Normal mood and affect   RESULTS  Summary of this visit's results, reviewed and interpreted by myself:   EKG Interpretation  Date/Time:    Ventricular Rate:    PR Interval:    QRS Duration:   QT Interval:    QTC Calculation:   R Axis:     Text Interpretation:         Laboratory Studies: Results for orders placed or performed during the hospital encounter of 04/10/22 (from the past 24 hour(s))  Urinalysis, Routine w reflex microscopic Urine, Clean Catch     Status: Abnormal   Collection Time: 04/09/22 10:55 PM  Result Value Ref Range   Color, Urine YELLOW YELLOW   APPearance HAZY (A) CLEAR   Specific Gravity, Urine >=1.030 1.005 - 1.030   pH 5.5 5.0 - 8.0   Glucose, UA NEGATIVE NEGATIVE mg/dL   Hgb urine dipstick TRACE (A) NEGATIVE   Bilirubin Urine NEGATIVE NEGATIVE   Ketones, ur NEGATIVE NEGATIVE mg/dL   Protein, ur 30 (A) NEGATIVE mg/dL   Nitrite NEGATIVE  NEGATIVE   Leukocytes,Ua NEGATIVE NEGATIVE  Pregnancy, urine     Status: None   Collection Time: 04/09/22 10:55 PM  Result Value Ref Range   Preg Test, Ur NEGATIVE NEGATIVE  Urinalysis, Microscopic (reflex)     Status: Abnormal   Collection Time: 04/09/22 10:55 PM  Result Value Ref Range   RBC / HPF 0-5 0 - 5 RBC/hpf   WBC, UA 0-5 0 - 5 WBC/hpf   Bacteria, UA RARE (A) NONE SEEN   Squamous Epithelial / LPF 6-10 0 - 5  Lipase, blood     Status: None   Collection Time: 04/10/22 12:22 AM  Result Value Ref Range   Lipase 31 11 - 51 U/L  Comprehensive metabolic panel      Status: Abnormal   Collection Time: 04/10/22 12:22 AM  Result Value Ref Range   Sodium 136 135 - 145 mmol/L   Potassium 4.2 3.5 - 5.1 mmol/L   Chloride 105 98 - 111 mmol/L   CO2 25 22 - 32 mmol/L   Glucose, Bld 218 (H) 70 - 99 mg/dL   BUN 10 6 - 20 mg/dL   Creatinine, Ser 3.82 0.44 - 1.00 mg/dL   Calcium 8.8 (L) 8.9 - 10.3 mg/dL   Total Protein 8.1 6.5 - 8.1 g/dL   Albumin 3.6 3.5 - 5.0 g/dL   AST 21 15 - 41 U/L   ALT 17 0 - 44 U/L   Alkaline Phosphatase 75 38 - 126 U/L   Total Bilirubin 1.1 0.3 - 1.2 mg/dL   GFR, Estimated >50 >53 mL/min   Anion gap 6 5 - 15  CBC     Status: Abnormal   Collection Time: 04/10/22 12:22 AM  Result Value Ref Range   WBC 9.0 4.0 - 10.5 K/uL   RBC 5.39 (H) 3.87 - 5.11 MIL/uL   Hemoglobin 14.9 12.0 - 15.0 g/dL   HCT 97.6 73.4 - 19.3 %   MCV 84.2 80.0 - 100.0 fL   MCH 27.6 26.0 - 34.0 pg   MCHC 32.8 30.0 - 36.0 g/dL   RDW 79.0 24.0 - 97.3 %   Platelets 357 150 - 400 K/uL   nRBC 0.0 0.0 - 0.2 %   Imaging Studies: CT ABDOMEN PELVIS W CONTRAST  Result Date: 04/10/2022 CLINICAL DATA:  Left lower quadrant pain for 2 days EXAM: CT ABDOMEN AND PELVIS WITH CONTRAST TECHNIQUE: Multidetector CT imaging of the abdomen and pelvis was performed using the standard protocol following bolus administration of intravenous contrast. RADIATION DOSE REDUCTION: This exam was performed according to the departmental dose-optimization program which includes automated exposure control, adjustment of the mA and/or kV according to patient size and/or use of iterative reconstruction technique. CONTRAST:  OMNIPAQUE IOHEXOL 300 MG/ML  SOLN COMPARISON:  05/06/2021 FINDINGS: Lower chest: No acute abnormality. Hepatobiliary: No focal liver abnormality is seen. Status post cholecystectomy. No biliary dilatation. Pancreas: Unremarkable. No pancreatic ductal dilatation or surrounding inflammatory changes. Spleen: Normal in size without focal abnormality. Adrenals/Urinary Tract:  Adrenal glands are within normal limits. Kidneys demonstrate a normal enhancement pattern bilaterally. No renal calculi or obstructive changes are seen. Ureters are within normal limits. The bladder is decompressed. Stomach/Bowel: Mild diverticulosis is noted. No evidence of diverticulitis is seen. The appendix is within normal limits. Small bowel and stomach are unremarkable. Vascular/Lymphatic: No significant vascular findings are present. No enlarged abdominal or pelvic lymph nodes. Reproductive: Uterus and bilateral adnexa are unremarkable. Other: No abdominal wall hernia or abnormality. No abdominopelvic ascites.  Musculoskeletal: No acute or significant osseous findings. IMPRESSION: Diverticulosis without diverticulitis. No other focal abnormality is noted. Electronically Signed   By: Alcide Clever M.D.   On: 04/10/2022 02:03    ED COURSE and MDM  Nursing notes, initial and subsequent vitals signs, including pulse oximetry, reviewed and interpreted by myself.  Vitals:   04/09/22 2251 04/09/22 2252 04/10/22 0130  BP:  (!) 151/109 (!) 122/92  Pulse:  88 76  Resp:  18 18  Temp:  99.2 F (37.3 C)   TempSrc:  Oral   SpO2:  98% 99%  Weight: 133.8 kg    Height: 5\' 3"  (1.6 m)     Medications  HYDROcodone-acetaminophen (NORCO/VICODIN) 5-325 MG per tablet 1 tablet (has no administration in time range)  sodium chloride 0.9 % bolus 1,000 mL (1,000 mLs Intravenous New Bag/Given 04/10/22 0106)  ondansetron (ZOFRAN) injection 4 mg (4 mg Intravenous Given 04/10/22 0104)  fentaNYL (SUBLIMAZE) injection 100 mcg (100 mcg Intravenous Given 04/10/22 0105)  iohexol (OMNIPAQUE) 300 MG/ML solution 100 mL (100 mLs Intravenous Contrast Given 04/10/22 0139)   2:12 AM No diverticulitis seen on CT scan.  No evidence of obstructive uropathy.  No urinary tract infection seen on urinalysis.  Patient's urine was concentrated and she was hydrated with a liter of normal saline.  The cause of her pain is unclear but could be  musculoskeletal.  She was advised to return if symptoms worsen.   PROCEDURES  Procedures   ED DIAGNOSES     ICD-10-CM   1. Left lower quadrant abdominal pain  R10.32          06/11/22, MD 04/10/22 [phone]
